# Patient Record
Sex: Female | Born: 1935 | Race: White | Hispanic: No | Smoking: Former smoker
Health system: Southern US, Community
[De-identification: ages and names within clinical notes are randomized; demographics above are authoritative.]

## PROBLEM LIST (undated history)

## (undated) DIAGNOSIS — M1990 Unspecified osteoarthritis, unspecified site: Secondary | ICD-10-CM

## (undated) DIAGNOSIS — K589 Irritable bowel syndrome without diarrhea: Secondary | ICD-10-CM

## (undated) DIAGNOSIS — E785 Hyperlipidemia, unspecified: Secondary | ICD-10-CM

## (undated) DIAGNOSIS — R42 Dizziness and giddiness: Secondary | ICD-10-CM

## (undated) DIAGNOSIS — K649 Unspecified hemorrhoids: Secondary | ICD-10-CM

## (undated) DIAGNOSIS — Z972 Presence of dental prosthetic device (complete) (partial): Secondary | ICD-10-CM

## (undated) DIAGNOSIS — G629 Polyneuropathy, unspecified: Secondary | ICD-10-CM

## (undated) DIAGNOSIS — Z974 Presence of external hearing-aid: Secondary | ICD-10-CM

## (undated) DIAGNOSIS — I10 Essential (primary) hypertension: Secondary | ICD-10-CM

## (undated) DIAGNOSIS — K219 Gastro-esophageal reflux disease without esophagitis: Secondary | ICD-10-CM

## (undated) HISTORY — DX: Unspecified hemorrhoids: K64.9

## (undated) HISTORY — DX: Irritable bowel syndrome, unspecified: K58.9

## (undated) HISTORY — DX: Gastro-esophageal reflux disease without esophagitis: K21.9

## (undated) HISTORY — DX: Essential (primary) hypertension: I10

## (undated) HISTORY — DX: Hyperlipidemia, unspecified: E78.5

## (undated) HISTORY — DX: Unspecified osteoarthritis, unspecified site: M19.90

---

## 1978-09-10 HISTORY — PX: ABDOMINAL HYSTERECTOMY: SHX81

## 2005-03-29 ENCOUNTER — Ambulatory Visit: Payer: Self-pay | Admitting: Ophthalmology

## 2005-04-03 ENCOUNTER — Ambulatory Visit: Payer: Self-pay | Admitting: Ophthalmology

## 2005-07-09 ENCOUNTER — Ambulatory Visit: Payer: Self-pay | Admitting: Ophthalmology

## 2005-12-25 ENCOUNTER — Ambulatory Visit: Payer: Self-pay

## 2007-11-13 ENCOUNTER — Ambulatory Visit: Payer: Self-pay

## 2008-11-15 ENCOUNTER — Ambulatory Visit: Payer: Self-pay

## 2013-10-12 ENCOUNTER — Encounter: Payer: Self-pay | Admitting: *Deleted

## 2013-11-02 ENCOUNTER — Ambulatory Visit: Payer: Self-pay | Admitting: General Surgery

## 2013-11-17 ENCOUNTER — Encounter: Payer: Self-pay | Admitting: General Surgery

## 2013-11-17 ENCOUNTER — Ambulatory Visit (INDEPENDENT_AMBULATORY_CARE_PROVIDER_SITE_OTHER): Payer: Medicare Other | Admitting: General Surgery

## 2013-11-17 VITALS — BP 140/72 | HR 76 | Resp 14 | Ht 64.0 in | Wt 152.0 lb

## 2013-11-17 DIAGNOSIS — Z1211 Encounter for screening for malignant neoplasm of colon: Secondary | ICD-10-CM

## 2013-11-17 MED ORDER — POLYETHYLENE GLYCOL 3350 17 GM/SCOOP PO POWD: ORAL | Status: DC

## 2013-11-17 NOTE — Patient Instructions (Addendum)
Patient to be schedule for screening colonoscopy. The patient is aware to call back for any questions or concerns.  Colonoscopy A colonoscopy is an exam to look at the entire large intestine (colon). This exam can help find problems such as tumors, polyps, inflammation, and areas of bleeding. The exam takes about 1 hour.  LET [facility] PROVIDER KNOW ABOUT:   Any allergies you have.  All medicines you are taking, including vitamins, herbs, eye drops, creams, and over-the-counter medicines.  Previous problems you or members of your family have had with the use of anesthetics.  Any blood disorders you have.  Previous surgeries you have had.  Medical conditions you have. RISKS AND COMPLICATIONS  Generally, this is a safe procedure. However, as with any procedure, complications can occur. Possible complications include:  Bleeding.  Tearing or rupture of the colon wall.  Reaction to medicines given during the exam.  Infection (rare). BEFORE THE PROCEDURE   Ask your health care provider about changing or stopping your regular medicines.  You may be prescribed an oral bowel prep. This involves drinking a large amount of medicated liquid, starting the day before your procedure. The liquid will cause you to have multiple loose stools until your stool is almost clear or light green. This cleans out your colon in preparation for the procedure.  Do not eat or drink anything else once you have started the bowel prep, unless your health care provider tells you it is safe to do so.  Arrange for someone to drive you home after the procedure. PROCEDURE   You will be given medicine to help you relax (sedative).  You will lie on your side with your knees bent.  A long, flexible tube with a light and camera on the end (colonoscope) will be inserted through the rectum and into the colon. The camera sends video back to a computer screen as it moves through the colon. The colonoscope also  releases carbon dioxide gas to inflate the colon. This helps your health care provider see the area better.  During the exam, your health care provider may take a small tissue sample (biopsy) to be examined under a microscope if any abnormalities are found.  The exam is finished when the entire colon has been viewed. AFTER THE PROCEDURE   Do not drive for 24 hours after the exam.  You may have a small amount of blood in your stool.  You may pass moderate amounts of gas and have mild abdominal cramping or bloating. This is caused by the gas used to inflate your colon during the exam.  Ask when your test results will be ready and how you will get your results. Make sure you get your test results. Document Released: 08/24/2000 Document Revised: 06/17/2013 Document Reviewed: 05/04/2013 [facility] Patient Information 2014 [employer] [place].  Patient [facility]. It is okay for patient to continue 81 mg aspirin.

## 2013-11-17 NOTE — Progress Notes (Signed)
Patient ID: Zoe Bush, female   DOB: 1933-12-16, 80 y.o.   MRN: [id number]  Chief Complaint  Patient presents with  . Colonoscopy    HPI Zoe Bush is a 80 y.o. female who presents for an evaluation of a colonoscopy. The patient denies any bowel problems at this time. She has never had a colonoscopy done before.   HPI  Past Medical History  Diagnosis Date  . Hypertension   . GERD (gastroesophageal reflux disease)   . Hyperlipidemia   . Hemorrhoid   . IBS (irritable bowel syndrome)   . Arthritis     Past Surgical History  Procedure Laterality Date  . Abdominal hysterectomy  1980    Family History  Problem Relation Age of Onset  . Heart disease Father   . Cancer Mother     brain tumor  . Cancer Sister [id number]    ovarian    Social History History  Substance Use Topics  . Smoking status: Never Smoker   . Smokeless tobacco: Not on file  . Alcohol Use: No    No Known Allergies  Current Outpatient Prescriptions  Medication Sig Dispense Refill  . aspirin 81 MG tablet Take 81 mg by mouth daily.      Marland Kitchen. atenolol (TENORMIN) 50 MG tablet Take 2 tablets by mouth daily.      Marland Kitchen. docusate sodium (COLACE) 100 MG capsule Take 100 mg by mouth 2 (two) times daily.      Marland Kitchen. omeprazole (PRILOSEC) 20 MG capsule Take 20 mg by mouth daily.      . simvastatin (ZOCOR) 80 MG tablet Take 1 tablet by mouth daily.      . polyethylene glycol powder (GLYCOLAX/MIRALAX) powder 255 grams one bottle for colonoscopy prep  255 g  0   No current facility-administered medications for this visit.    Review of Systems Review of Systems  Constitutional: Negative.   Respiratory: Negative.   Cardiovascular: Negative.   Gastrointestinal: Negative.     Blood pressure 140/72, pulse 76, resp. rate 14, height 5\' 4"  (1.626 m), weight 152 lb (68.947 kg).  Physical Exam Physical Exam  Constitutional: She is oriented to person, place, and time. She appears well-developed and well-nourished.  Neck: Neck  supple. No thyromegaly present.  Cardiovascular: Normal rate, regular rhythm and normal heart sounds.   No murmur heard. Pulmonary/Chest: Effort normal and breath sounds normal.  Abdominal: Soft. Bowel sounds are normal. There is no hepatosplenomegaly. There is no tenderness. No hernia.  Lymphadenopathy:    She has no cervical adenopathy.  Neurological: She is alert and oriented to person, place, and time.  Skin: Skin is warm and dry.    Data Reviewed    Assessment    Pt is willing to have screening colonoscopy.     Plan      Procedure , risks and benefits discussed.    This patient has been [facility]. It is okay for patient to continue 81 mg aspirin.    Django Nguyen G 11/18/2013, 6:19 AM

## 2013-11-18 ENCOUNTER — Encounter: Payer: Self-pay | Admitting: General Surgery

## 2013-12-08 ENCOUNTER — Telehealth: Payer: Self-pay

## 2013-12-08 NOTE — Telephone Encounter (Signed)
Patient called to cancel [facility]. She states that she does not want to have this done at this time. She will call back if she would like to reschedule this.

## 2014-07-12 ENCOUNTER — Encounter: Payer: Self-pay | Admitting: General Surgery

## 2014-09-15 ENCOUNTER — Ambulatory Visit: Payer: Self-pay | Admitting: Physician Assistant

## 2014-09-15 ENCOUNTER — Ambulatory Visit: Payer: Self-pay | Admitting: Family Medicine

## 2014-09-21 ENCOUNTER — Ambulatory Visit: Payer: Self-pay | Admitting: Family Medicine

## 2014-09-30 ENCOUNTER — Ambulatory Visit: Payer: Self-pay | Admitting: Family Medicine

## 2014-10-21 ENCOUNTER — Ambulatory Visit: Payer: Self-pay | Admitting: Family Medicine

## 2015-01-21 ENCOUNTER — Telehealth: Payer: Self-pay | Admitting: Internal Medicine

## 2015-01-21 ENCOUNTER — Other Ambulatory Visit: Payer: Self-pay | Admitting: Internal Medicine

## 2015-01-21 DIAGNOSIS — M810 Age-related osteoporosis without current pathological fracture: Secondary | ICD-10-CM | POA: Insufficient documentation

## 2015-01-21 NOTE — Telephone Encounter (Signed)
She has osteoporosis by recent bone density scan done May 2016   Bone Density scores received, she has bone loss which is significant  And indicative for osteoporosis.  I recommend treating with medication but would need to discuss the pros and cons in an office visit . Increase  calcium to 1800 mg daily through diet and supplements ,  2000 units of vitamin d and weight bearing exercise on a regular basis, and make appt to discuss    I recommend getting the majority of your calcium and Vitamin D  through diet rather than supplements given the recent association of calcium supplements with increased coronary artery calcium scores  Try the Silk almond Light,  Original formula.  Delicious,  Low carb,  Low cal,  Cholesterol free

## 2015-01-21 NOTE — Telephone Encounter (Signed)
Noted in incorrect chart.

## 2015-08-29 ENCOUNTER — Ambulatory Visit
Admission: EM | Admit: 2015-08-29 | Discharge: 2015-08-29 | Disposition: A | Discharge status: Home / Self Care | Payer: Medicare Other | Attending: Emergency Medicine | Admitting: Emergency Medicine

## 2015-08-29 DIAGNOSIS — K1120 Sialoadenitis, unspecified: Secondary | ICD-10-CM

## 2015-08-29 DIAGNOSIS — R220 Localized swelling, mass and lump, head: Secondary | ICD-10-CM

## 2015-08-29 LAB — BASIC METABOLIC PANEL
Anion gap: 9 (ref 5–15)
BUN: 14 mg/dL (ref 6–20)
CALCIUM: 10.4 mg/dL — AB (ref 8.9–10.3)
CO2: 25 mmol/L (ref 22–32)
Chloride: 106 mmol/L (ref 101–111)
Creatinine, Ser: 1 mg/dL (ref 0.44–1.00)
GFR, EST NON AFRICAN AMERICAN: 52 mL/min — AB (ref >60)
Glucose, Bld: 117 mg/dL — ABNORMAL HIGH (ref 65–99)
POTASSIUM: 4.4 mmol/L (ref 3.5–5.1)
SODIUM: 140 mmol/L (ref 135–145)

## 2015-08-29 MED ORDER — AMOXICILLIN-POT CLAVULANATE 875-125 MG PO TABS: 1.0000 | ORAL_TABLET | Freq: Two times a day (BID) | ORAL | Status: DC

## 2015-08-29 NOTE — Discharge Instructions (Signed)
Increase your fluid intake. You may do warm saltwater gargles/rinses to help keep mouth clean. Try some sour candy like lemon drops or cabbage patch sour kids candy to help express any stone that you may have. Make sure you finish all the antibiotics. You may take Tylenol and 400 mg of ibuprofen as needed for pain. Do not take the ibuprofen for more than a week. Follow-up with Dr. Danie Chandlerronk. I am writing her a note today to let her know that I saw you today in clinic and that your kidney function was acceptable, so you should be able to get your CT with contrast.

## 2015-08-29 NOTE — ED Notes (Signed)
Patient complains of right ear pain. She has a noticeable knot on the side of her jaw and states that she has had this knot for one year but, was never painful until today. She states that she was going to have a CT on knot but, has not scheduled her appointment yet. She states that she feels like she has an ear ache. Patient does wear hearing aids in both ears and also has had all teeth removed.

## 2015-08-29 NOTE — ED Provider Notes (Signed)
HPI  SUBJECTIVE:  Zoe Bush is a 81 y.o. female who presents with painful swelling at the angle of her right jaw. States that she has had a "knot" there for the past year, is supposed to have a CT done by her PMD for this in January, but states that it became acutely larger and painful today. It was not painful up until today. Patient describes pain as stabbing, constant. She reports right ear pain. Symptoms are worse with applying heating pad, no alleviating factors. She has also tried Tylenol and ice. No nausea, vomiting, fevers, change in hearing, sore throat, upper respiratory like symptoms. No facial redness. The jaw swelling does not wax and wane in size. It is not associated with eating, salivation. She denies stiff neck. No trauma. No trismus. She is completely edentulous. Past medical history negative for cancer, diabetes, hypertension. She has a 5 year pack history of smoking, quit over 50 years ago. She states that she has had an ultrasound of this knot, which was negative. No new changes in medicines.    Past Medical History  Diagnosis Date  . Hypertension   . GERD (gastroesophageal reflux disease)   . Hyperlipidemia   . Hemorrhoid   . IBS (irritable bowel syndrome)   . Arthritis     Past Surgical History  Procedure Laterality Date  . Abdominal hysterectomy  1980    Family History  Problem Relation Age of Onset  . Heart disease Father   . Cancer Mother     brain tumor  . Cancer Sister [id number]    ovarian    Social History  Substance Use Topics  . Smoking status: Never Smoker   . Smokeless tobacco: None  . Alcohol Use: No    No current facility-administered medications for this encounter.  Current outpatient prescriptions:  .  aspirin 81 MG tablet, Take 81 mg by mouth daily., Disp: , Rfl:  .  atenolol (TENORMIN) 50 MG tablet, Take 2 tablets by mouth daily., Disp: , Rfl:  .  docusate sodium (COLACE) 100 MG capsule, Take 100 mg by mouth 2 (two) times daily., Disp:  , Rfl:  .  doxepin (SINEQUAN) 100 MG capsule, Take 100 mg by mouth., Disp: , Rfl:  .  omeprazole (PRILOSEC) 20 MG capsule, Take 20 mg by mouth daily., Disp: , Rfl:  .  polyethylene glycol powder (GLYCOLAX/MIRALAX) powder, 255 grams one bottle for colonoscopy prep, Disp: 255 g, Rfl: 0 .  simvastatin (ZOCOR) 80 MG tablet, Take 1 tablet by mouth daily., Disp: , Rfl:  .  amoxicillin-clavulanate (AUGMENTIN) 875-125 MG tablet, Take 1 tablet by mouth 2 (two) times daily. X 10 days, Disp: 20 tablet, Rfl: 0  No Known Allergies   ROS  As noted in HPI.   Physical Exam  BP 163/57 mmHg  Pulse 63  Temp(Src) 98.3 F (36.8 C) (Tympanic)  Resp 16  Ht 5\' 4"  (1.626 m)  Wt 160 lb (72.576 kg)  BMI 27.45 kg/m2  SpO2 100%  Constitutional: Well developed, well nourished, no acute distress. Voice normal.  Eyes:  EOMI, conjunctiva normal bilaterally HENT: Normocephalic, atraumatic,mucus membranes moist. Edentulous. No gingival tenderness, swelling. No swelling underneath the tongue. No expressible purulent drainage from [facility] or Stensen's duct. No tenderness over the parotid gland. Bilateral TMs normal. Positive tender swelling at angle of the right jaw extending inferiorly to jaw along submandibular gland. No mastoid tenderness   neck: No cervical lymphadenopathy, no meningismus. Respiratory: Normal inspiratory effort Cardiovascular: Normal rate GI: nondistended  skin: No rash, skin intact Musculoskeletal: no deformities Neurologic: Alert & oriented x 3, no focal neuro deficits Psychiatric: Speech and behavior appropriate   ED Course   Medications - No data to display  Orders Placed This Encounter  Procedures  . Basic metabolic panel    Standing Status: Standing     Number of Occurrences: 1     Standing Expiration Date:     Results for orders placed or performed during the hospital encounter of 08/29/15 (from the past 24 hour(s))  Basic metabolic panel     Status: Abnormal   Collection  Time: 08/29/15  8:33 PM  Result Value Ref Range   Sodium 140 135 - 145 mmol/L   Potassium 4.4 3.5 - 5.1 mmol/L   Chloride 106 101 - 111 mmol/L   CO2 25 22 - 32 mmol/L   Glucose, Bld 117 (H) 65 - 99 mg/dL   BUN 14 6 - 20 mg/dL   Creatinine, Ser 1.61 0.44 - 1.00 mg/dL   Calcium 09.6 (H) 8.9 - 10.3 mg/dL   GFR calc non Af Amer 52 (L) >60 mL/min   GFR calc Af Amer >60 >60 mL/min   Anion gap 9 5 - 15   No results found.  ED Clinical Impression  Sialadenitis  Jaw swelling   ED Assessment/Plan  No u/s results in Epic.  Presentation most suggestive of a sialoadenitis. Given that she is elderly bacterial/supperative sialadenitis high in differential. Viral infection, tumor also on differential. We'll check kidney function to evaluate patient's ability get IV contrast. We will write her primary care physician and request that CT with IV contrast be done as an outpatient within the week. Home with Augmentin, lemon drops, improved oral hygiene,  salt water rinses, increase fluid intake.  Lab reviewed. GFR 52, acceptable  for IV contrast. Patient to follow-up with primary care physician in several days. To the ER for any airway compromise, fevers, stiff neck. Discussed labs, MDM, plan and followup with patient  Discussed sn/sx that should prompt return to the UC or ED. Patient agrees with plan.  *This clinic note was created using Dragon dictation software. Therefore, there may be occasional mistakes despite careful proofreading.  ?   Domenick Gong, MD 08/29/15 2135

## 2015-09-08 ENCOUNTER — Other Ambulatory Visit: Payer: Self-pay | Admitting: Family Medicine

## 2015-09-08 DIAGNOSIS — R220 Localized swelling, mass and lump, head: Secondary | ICD-10-CM

## 2015-09-09 ENCOUNTER — Ambulatory Visit: Payer: Medicare Other

## 2015-09-13 ENCOUNTER — Ambulatory Visit: Payer: Medicare Other

## 2015-09-15 ENCOUNTER — Ambulatory Visit
Admission: RE | Admit: 2015-09-15 | Discharge: 2015-09-15 | Disposition: A | Discharge status: Home / Self Care | Payer: Medicare Other | Source: Ambulatory Visit | Attending: Family Medicine | Admitting: Family Medicine

## 2015-09-15 DIAGNOSIS — R221 Localized swelling, mass and lump, neck: Secondary | ICD-10-CM | POA: Diagnosis not present

## 2015-09-15 DIAGNOSIS — R220 Localized swelling, mass and lump, head: Secondary | ICD-10-CM

## 2015-09-15 MED ORDER — IOHEXOL 300 MG/ML  SOLN
75.0000 mL | Freq: Once | INTRAMUSCULAR | Status: AC | PRN
  Administered 2015-09-15: 75 mL via INTRAVENOUS

## 2015-10-12 ENCOUNTER — Other Ambulatory Visit: Payer: Self-pay | Admitting: Physician Assistant

## 2015-10-12 DIAGNOSIS — R928 Other abnormal and inconclusive findings on diagnostic imaging of breast: Secondary | ICD-10-CM

## 2015-10-26 ENCOUNTER — Other Ambulatory Visit: Payer: Medicare Other

## 2015-10-26 ENCOUNTER — Ambulatory Visit: Payer: Medicare Other

## 2016-10-07 IMAGING — CR DG KNEE COMPLETE 4+V*R*
4 series · 4 of 4 positions shown · non-contrast
Comparison: None.

CLINICAL DATA: Chronic right knee pain, increasing over the last 2
month, some burning in the right thigh recently

EXAM:
RIGHT KNEE - COMPLETE 4+ VIEW

[knee ap]
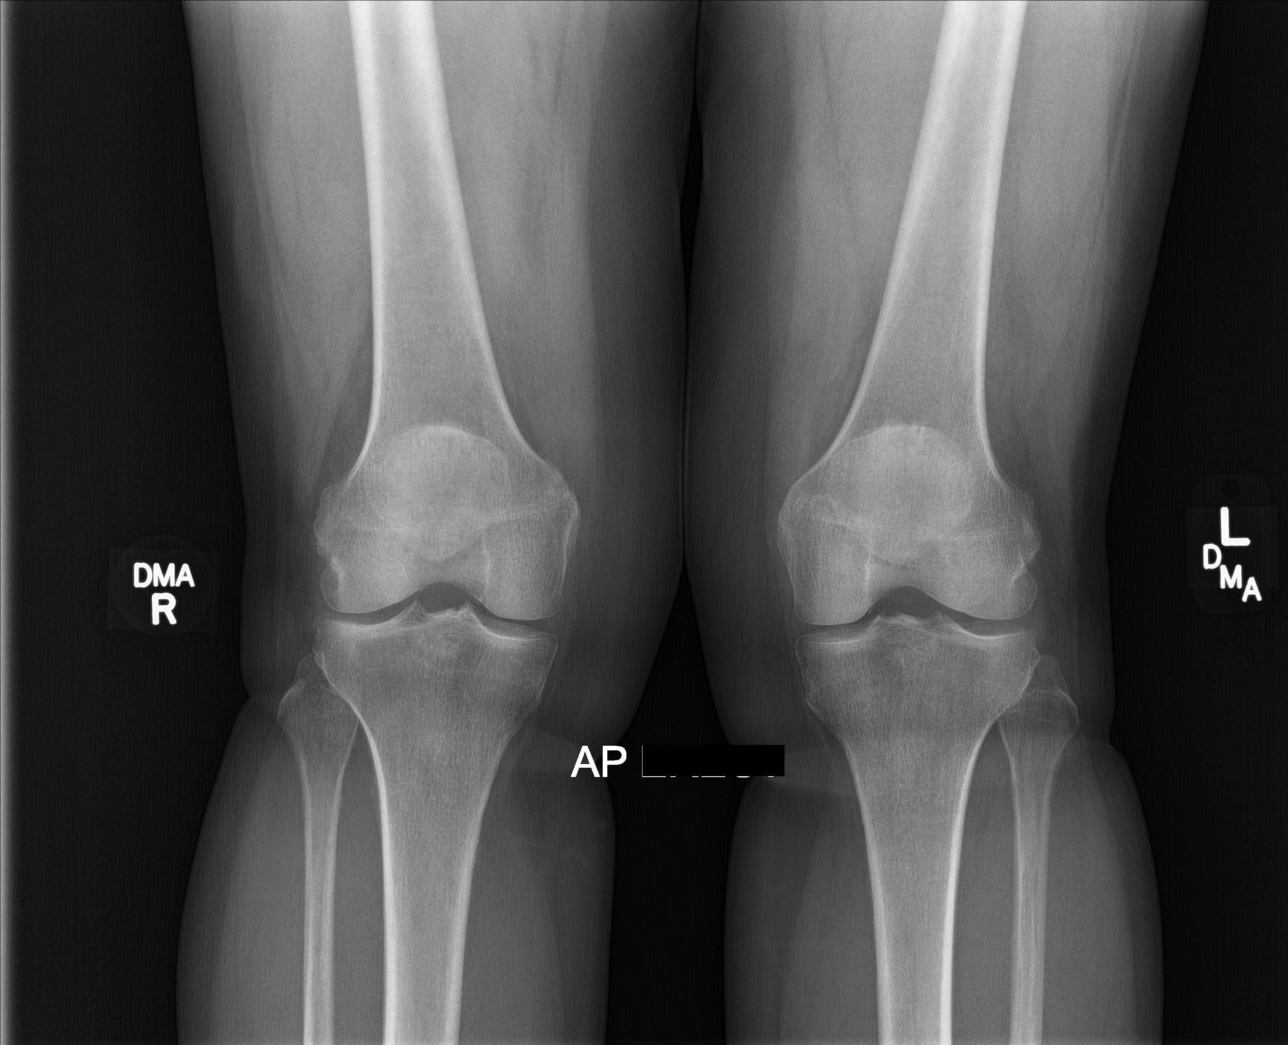

[knee obl (1 of 2)]
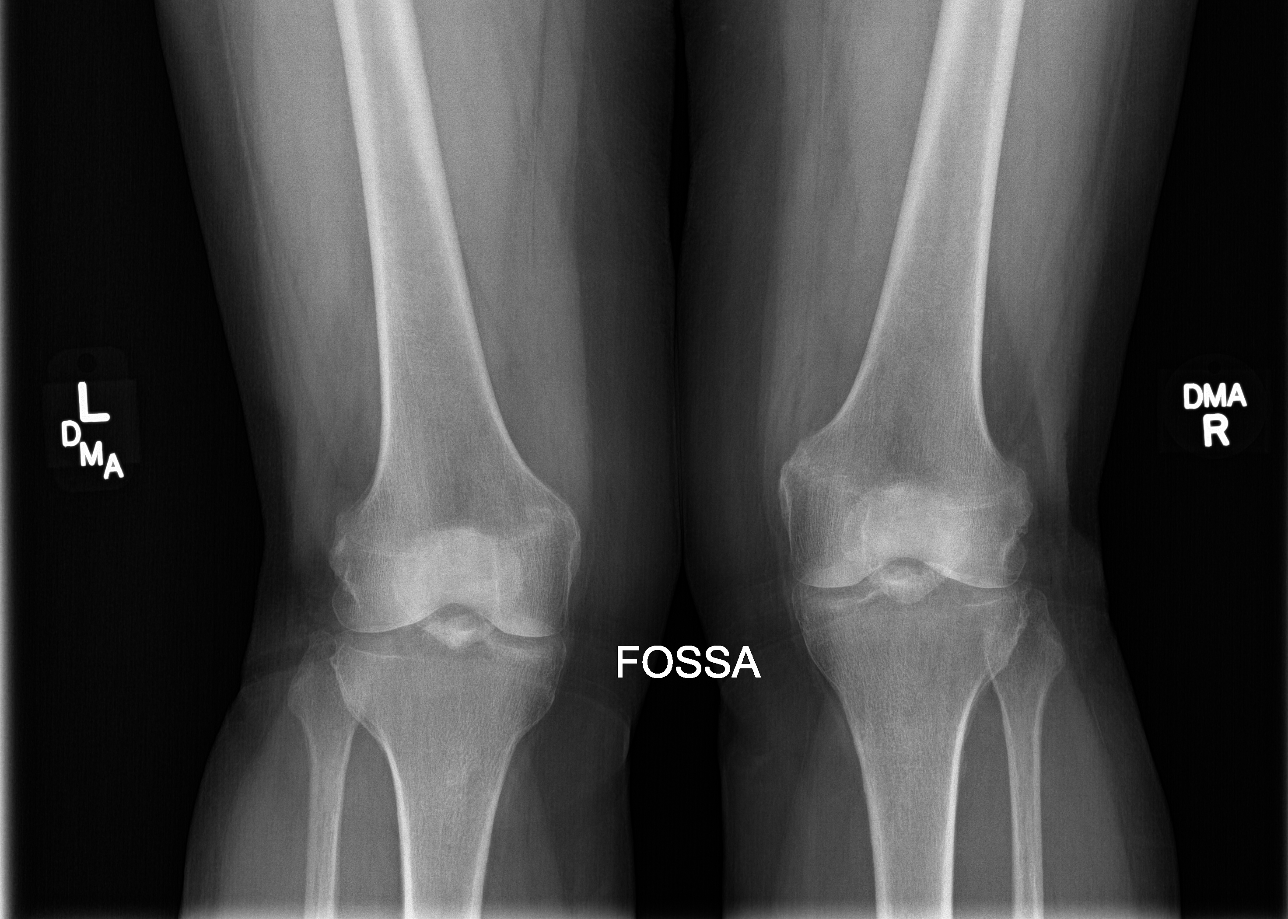

[knee obl (2 of 2)]
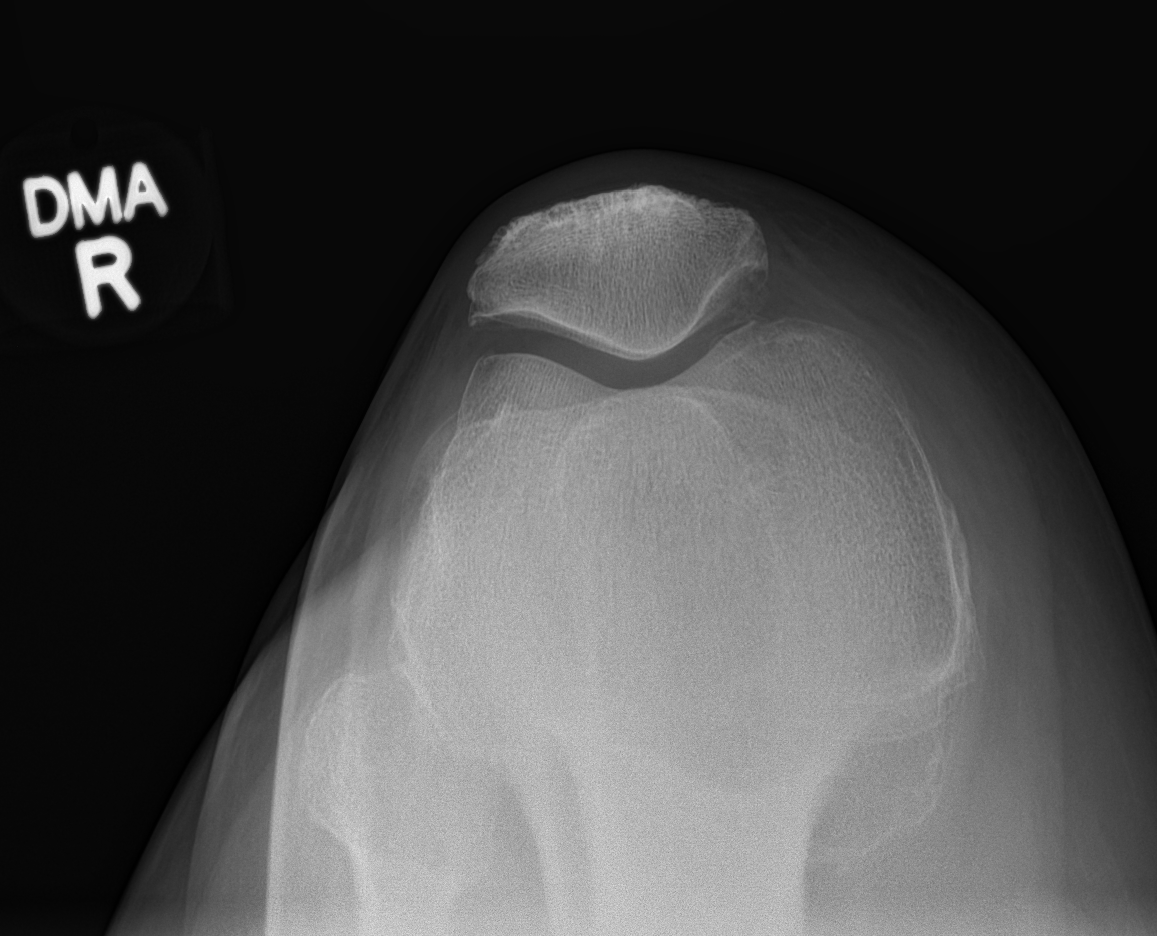

[knee lat]
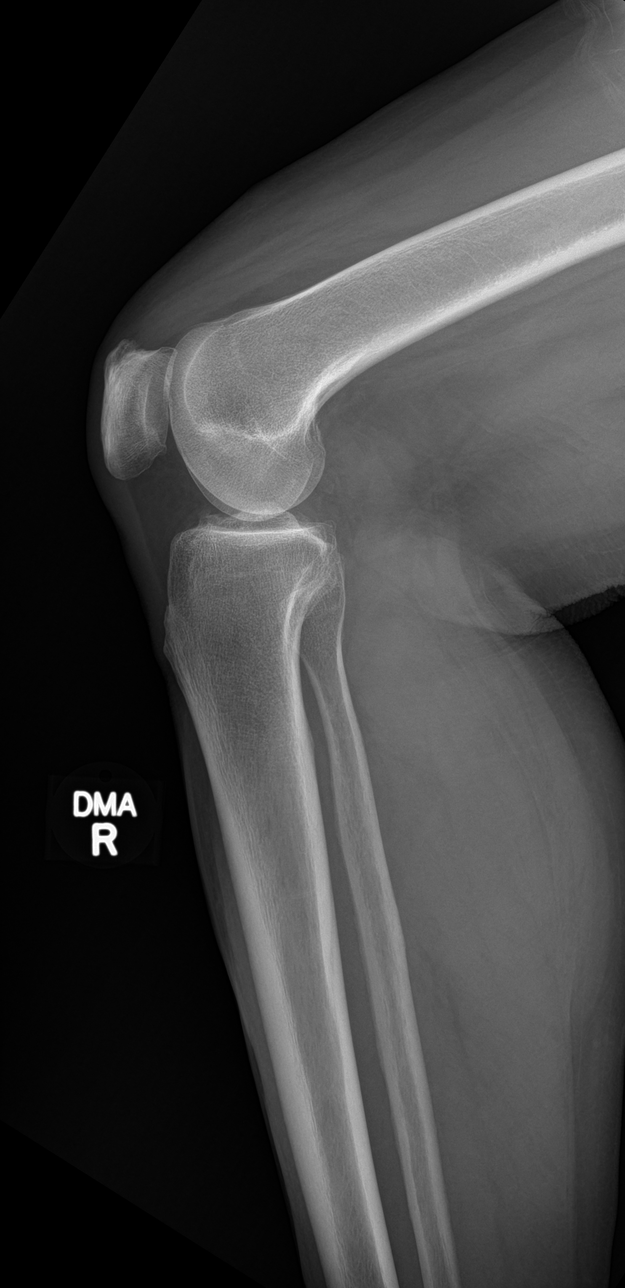

[4 of 4 positions shown; findings below may reference images not displayed]

FINDINGS: Standing views of the knees in the frontal position show some loss
of joint space involving the lateral compartment of the right knee
with mild degenerative spurring. Otherwise the knee joint spaces
appear well preserved. No right joint effusion is seen. The patella
on the right is in normal position with only mild spurring present
for age.
IMPRESSION: Mild degenerative joint disease of the right knee involving
primarily the lateral compartment. No fracture or effusion.

## 2021-07-04 ENCOUNTER — Encounter: Payer: Self-pay | Admitting: Anesthesiology

## 2021-07-04 ENCOUNTER — Encounter: Payer: Self-pay | Admitting: Ophthalmology

## 2021-07-17 ENCOUNTER — Ambulatory Visit: Admission: RE | Admit: 2021-07-17 | Payer: Medicare Other | Source: Home / Self Care | Admitting: Ophthalmology

## 2021-07-17 HISTORY — DX: Polyneuropathy, unspecified: G62.9

## 2021-07-17 HISTORY — DX: Dizziness and giddiness: R42

## 2021-07-17 HISTORY — DX: Presence of dental prosthetic device (complete) (partial): Z97.2

## 2021-07-17 HISTORY — DX: Presence of external hearing-aid: Z97.4

## 2021-07-17 SURGERY — PHACOEMULSIFICATION, CATARACT, WITH IOL INSERTION
Anesthesia: Topical | Laterality: Right

## 2022-04-12 ENCOUNTER — Encounter: Payer: Self-pay | Admitting: Ophthalmology

## 2022-04-12 NOTE — Discharge Instructions (Signed)

## 2022-04-17 ENCOUNTER — Other Ambulatory Visit: Payer: Self-pay

## 2022-04-17 ENCOUNTER — Ambulatory Visit (AMBULATORY_SURGERY_CENTER): Payer: Medicare Other | Admitting: Anesthesiology

## 2022-04-17 ENCOUNTER — Encounter
Admission: RE | Disposition: A | Discharge status: Home / Self Care | Payer: Self-pay | Source: Home / Self Care | Attending: Ophthalmology

## 2022-04-17 ENCOUNTER — Ambulatory Visit: Payer: Medicare Other | Admitting: Anesthesiology

## 2022-04-17 ENCOUNTER — Ambulatory Visit
Admission: RE | Admit: 2022-04-17 | Discharge: 2022-04-17 | Disposition: A | Discharge status: Home / Self Care | Payer: Medicare Other | Attending: Ophthalmology | Admitting: Ophthalmology

## 2022-04-17 ENCOUNTER — Encounter: Payer: Self-pay | Admitting: Ophthalmology

## 2022-04-17 DIAGNOSIS — I10 Essential (primary) hypertension: Secondary | ICD-10-CM | POA: Diagnosis not present

## 2022-04-17 DIAGNOSIS — H2511 Age-related nuclear cataract, right eye: Secondary | ICD-10-CM | POA: Insufficient documentation

## 2022-04-17 DIAGNOSIS — H2589 Other age-related cataract: Secondary | ICD-10-CM | POA: Diagnosis not present

## 2022-04-17 DIAGNOSIS — Z87891 Personal history of nicotine dependence: Secondary | ICD-10-CM | POA: Diagnosis not present

## 2022-04-17 DIAGNOSIS — Z79899 Other long term (current) drug therapy: Secondary | ICD-10-CM | POA: Insufficient documentation

## 2022-04-17 DIAGNOSIS — K219 Gastro-esophageal reflux disease without esophagitis: Secondary | ICD-10-CM | POA: Insufficient documentation

## 2022-04-17 HISTORY — PX: ANTERIOR VITRECTOMY: SHX1173

## 2022-04-17 HISTORY — PX: CATARACT EXTRACTION W/PHACO: SHX586

## 2022-04-17 SURGERY — PHACOEMULSIFICATION, CATARACT, WITH IOL INSERTION
Anesthesia: Monitor Anesthesia Care | Site: Eye | Laterality: Right

## 2022-04-17 MED ORDER — CARBACHOL 0.01 % IO SOLN
INTRAOCULAR | Status: DC | PRN
  Administered 2022-04-17: 0.5 mL via INTRAOCULAR

## 2022-04-17 MED ORDER — SIGHTPATH DOSE#1 BSS IO SOLN
INTRAOCULAR | Status: DC | PRN
  Administered 2022-04-17: 143 mL via OPHTHALMIC

## 2022-04-17 MED ORDER — TETRACAINE HCL 0.5 % OP SOLN
1.0000 [drp] | OPHTHALMIC | Status: AC | PRN
  Administered 2022-04-17 (×3): 1 [drp] via OPHTHALMIC

## 2022-04-17 MED ORDER — TRYPAN BLUE 0.06 % IO SOSY
PREFILLED_SYRINGE | INTRAOCULAR | Status: DC | PRN
  Administered 2022-04-17: 0.5 mL via INTRAOCULAR

## 2022-04-17 MED ORDER — ACETYLCHOLINE CHLORIDE 20 MG IO SOLR
INTRAOCULAR | Status: DC | PRN
  Administered 2022-04-17: 20 mg via INTRAOCULAR

## 2022-04-17 MED ORDER — SIGHTPATH DOSE#1 BSS IO SOLN
INTRAOCULAR | Status: DC | PRN
  Administered 2022-04-17: 15 mL

## 2022-04-17 MED ORDER — SIGHTPATH DOSE#1 NA CHONDROIT SULF-NA HYALURON 40-17 MG/ML IO SOLN
INTRAOCULAR | Status: DC | PRN
  Administered 2022-04-17: 1 mL via INTRAOCULAR

## 2022-04-17 MED ORDER — BSS IO SOLN
INTRAOCULAR | Status: DC | PRN
  Administered 2022-04-17 (×5): 15 mL

## 2022-04-17 MED ORDER — SIGHTPATH DOSE#1 BSS IO SOLN
INTRAOCULAR | Status: DC | PRN
  Administered 2022-04-17: .5 mL

## 2022-04-17 MED ORDER — PROPOFOL 10 MG/ML IV BOLUS
INTRAVENOUS | Status: DC | PRN
  Administered 2022-04-17: 30 mg via INTRAVENOUS

## 2022-04-17 MED ORDER — MOXIFLOXACIN HCL 0.5 % OP SOLN
OPHTHALMIC | Status: DC | PRN
  Administered 2022-04-17: 0.2 mL via OPHTHALMIC

## 2022-04-17 MED ORDER — NA CHONDROIT SULF-NA HYALURON 40-17 MG/ML IO SOLN
INTRAOCULAR | Status: DC | PRN
  Administered 2022-04-17: 1 mL via INTRAOCULAR

## 2022-04-17 MED ORDER — BRIMONIDINE TARTRATE-TIMOLOL 0.2-0.5 % OP SOLN
OPHTHALMIC | Status: DC | PRN
  Administered 2022-04-17: 1 [drp] via OPHTHALMIC

## 2022-04-17 MED ORDER — ONDANSETRON HCL 4 MG/2ML IJ SOLN: 4.0000 mg | Freq: Once | INTRAMUSCULAR | Status: DC | PRN

## 2022-04-17 MED ORDER — ARMC OPHTHALMIC DILATING DROPS
1.0000 | OPHTHALMIC | Status: AC | PRN
  Administered 2022-04-17 (×3): 1 via OPHTHALMIC

## 2022-04-17 MED ORDER — LIDOCAINE HCL 2 % IJ SOLN
INTRAMUSCULAR | Status: DC | PRN
  Administered 2022-04-17: 2.25 mL

## 2022-04-17 MED ORDER — FENTANYL CITRATE PF 50 MCG/ML IJ SOSY: 25.0000 ug | PREFILLED_SYRINGE | INTRAMUSCULAR | Status: DC | PRN

## 2022-04-17 MED ORDER — FENTANYL CITRATE (PF) 100 MCG/2ML IJ SOLN
INTRAMUSCULAR | Status: DC | PRN
  Administered 2022-04-17: 50 ug via INTRAVENOUS

## 2022-04-17 MED ORDER — MIDAZOLAM HCL 2 MG/2ML IJ SOLN
INTRAMUSCULAR | Status: DC | PRN
  Administered 2022-04-17: 1 mg via INTRAVENOUS

## 2022-04-17 MED ORDER — LACTATED RINGERS IV SOLN: INTRAVENOUS | Status: DC

## 2022-04-17 MED ORDER — BUPIVACAINE HCL (PF) 0.75 % IJ SOLN
INTRAMUSCULAR | Status: DC | PRN
  Administered 2022-04-17: 2.25 mL

## 2022-04-17 MED ORDER — TRIAMCINOLONE ACETONIDE 40 MG/ML IJ SUSP
INTRAMUSCULAR | Status: DC | PRN
  Administered 2022-04-17: .5 mL

## 2022-04-17 MED ORDER — NEOMYCIN-POLYMYXIN-DEXAMETH 3.5-10000-0.1 OP OINT
TOPICAL_OINTMENT | OPHTHALMIC | Status: DC | PRN
  Administered 2022-04-17: 1 via OPHTHALMIC

## 2022-04-17 SURGICAL SUPPLY — 28 items
BANDAGE EYE OVAL 2 1/8 X 2 5/8 (GAUZE/BANDAGES/DRESSINGS) ×2
BNDG EYE OVAL 2 1/8 X 2 5/8 (GAUZE/BANDAGES/DRESSINGS) IMPLANT
CANNULA ANT/CHMB 27G (MISCELLANEOUS) IMPLANT
CANNULA ANT/CHMB 27GA (MISCELLANEOUS) ×6 IMPLANT
CATARACT SUITE SIGHTPATH (MISCELLANEOUS) ×2 IMPLANT
FEE CATARACT SUITE SIGHTPATH (MISCELLANEOUS) ×1 IMPLANT
GLIDE IOL SHEET 35MLX5ML (MISCELLANEOUS) ×1 IMPLANT
GLOVE SURG ENC TEXT LTX SZ8 (GLOVE) ×2 IMPLANT
GLOVE SURG TRIUMPH 8.0 PF LTX (GLOVE) ×2 IMPLANT
LENS IOL +16.5 DIOP 13X5.5X (Intraocular Lens) ×1 IMPLANT
LENS IOL KELMAN MF 16.5 (Intraocular Lens) IMPLANT
LENS IOL KELMAN MULTIFLEX 16.5 (Intraocular Lens) ×2 IMPLANT
LENS IOL TECNIS EYHANCE 20.0 (Intraocular Lens) IMPLANT
NDL 18GX1X1/2 (RX/OR ONLY) (NEEDLE) IMPLANT
NDL FILTER BLUNT 18X1 1/2 (NEEDLE) ×1 IMPLANT
NDL HYPO 18GX1.5 BLUNT FILL (NEEDLE) IMPLANT
NDL RETROBULBAR .5 NSTRL (NEEDLE) ×1 IMPLANT
NEEDLE 18GX1X1/2 (RX/OR ONLY) (NEEDLE) ×4 IMPLANT
NEEDLE FILTER BLUNT 18X 1/2SAF (NEEDLE) ×1
NEEDLE FILTER BLUNT 18X1 1/2 (NEEDLE) ×1 IMPLANT
NEEDLE HYPO 18GX1.5 BLUNT FILL (NEEDLE) ×2 IMPLANT
PACK VIT ANT 23G (MISCELLANEOUS) ×1 IMPLANT
RING MALYGIN (MISCELLANEOUS) IMPLANT
SUT ETHILON 10-0 CS-B-6CS-B-6 (SUTURE) ×2
SUTURE EHLN 10-0 CS-B-6CS-B-6 (SUTURE) IMPLANT
SYR 3ML LL SCALE MARK (SYRINGE) ×4 IMPLANT
SYR 5ML LL (SYRINGE) ×1 IMPLANT
WATER STERILE IRR 250ML POUR (IV SOLUTION) ×2 IMPLANT

## 2022-04-17 NOTE — Transfer of Care (Signed)
Immediate Anesthesia Transfer of Care Note  Patient: Zoe Bush  Procedure(s) Performed: CATARACT EXTRACTION PHACO AND INTRAOCULAR LENS PLACEMENT (IOC) RIGHT VISION BLUE (Right: Eye) ANTERIOR VITRECTOMY (Right: Eye)  Patient Location: PACU  Anesthesia Type:MAC  Level of Consciousness: awake, alert  and oriented  Airway & Oxygen Therapy: Patient Spontanous Breathing  Post-op Assessment: Report given to RN and Post -op Vital signs reviewed and stable  Post vital signs: Reviewed and stable  Last Vitals:  Vitals Value Taken Time  BP    Temp    Pulse    Resp    SpO2      Last Pain:  Vitals:   04/17/22 0953  TempSrc: Temporal  PainSc: 0-No pain         Complications: No notable events documented.

## 2022-04-17 NOTE — Anesthesia Preprocedure Evaluation (Signed)
Anesthesia Evaluation  Patient identified by MRN, date of birth, ID band Patient awake    Reviewed: Allergy & Precautions, H&P , NPO status , Patient's Chart, lab work & pertinent test results, reviewed documented beta blocker date and time   Airway Mallampati: II  TM Distance: >3 FB Neck ROM: full    Dental no notable dental hx. (+) Teeth Intact   Pulmonary neg pulmonary ROS, former smoker,    Pulmonary exam normal breath sounds clear to auscultation       Cardiovascular Exercise Tolerance: Poor hypertension, On Medications negative cardio ROS   Rhythm:regular Rate:Normal     Neuro/Psych negative neurological ROS  negative psych ROS   GI/Hepatic negative GI ROS, Neg liver ROS, GERD  Medicated,  Endo/Other  negative endocrine ROSdiabetes  Renal/GU      Musculoskeletal   Abdominal   Peds  Hematology negative hematology ROS (+)   Anesthesia Other Findings   Reproductive/Obstetrics negative OB ROS                             Anesthesia Physical Anesthesia Plan  ASA: 2  Anesthesia Plan: MAC   Post-op Pain Management:    Induction:   PONV Risk Score and Plan:   Airway Management Planned:   Additional Equipment:   Intra-op Plan:   Post-operative Plan:   Informed Consent: I have reviewed the patients History and Physical, chart, labs and discussed the procedure including the risks, benefits and alternatives for the proposed anesthesia with the patient or authorized representative who has indicated his/her understanding and acceptance.       Plan Discussed with: CRNA  Anesthesia Plan Comments:         Anesthesia Quick Evaluation

## 2022-04-17 NOTE — Op Note (Addendum)
PREOPERATIVE DIAGNOSIS:  Nuclear sclerotic cataract of the right eye.   POSTOPERATIVE DIAGNOSIS:  Cataract   OPERATIVE PROCEDURE:ORPROCALL@   SURGEON:  Galen Manila, MD.   ANESTHESIA:  Anesthesiologist: Yevette Edwards, MD CRNA: Karoline Caldwell, CRNA  1.      Managed anesthesia care. 2.      0.70ml of Shugarcaine was instilled in the eye following the paracentesis. 3.retrobulbar block 4.5 cc   COMPLICATIONS:   A Seemingly stable Anterior capsule rent extended past the equator when the patient sat up violently and was yellling in fear. The anterior chamber shallowed and the lens came forward extending the rent.Just one quadrant of cat. Had been removed at that point.  The Remainder of the lens fell toward the vitreous. Anterior vitractomy was performed with Kenalog staining. The main incision was enlarged and the ACIOL was placed using a sheets glide. Three 10-0  sutures were placed and the eye was water tight. I/A was used to clean up the [facility].   [facility] will be contacted today for PPV lensectomy soon.  The events of the operation were discussed with patient and family.   TECHNIQUE:   Stop and chop   DESCRIPTION OF PROCEDURE:  The patient was examined and consented in the preoperative holding area where the aforementioned topical anesthesia was applied to the right eye and then brought back to the Operating Room where the right eye was prepped and draped in the usual sterile ophthalmic fashion and a lid speculum was placed. A paracentesis was created with the side port blade and the anterior chamber was filled with viscoelastic. A near clear corneal incision was performed with the steel keratome. A continuous curvilinear capsulorrhexis was performed with a cystotome followed by the capsulorrhexis forceps. Hydrodissection and hydrodelineation were carried out with BSS on a blunt cannula. The lens was removed in a stop and chop  technique and the remaining cortical material was removed with the  irrigation-aspiration handpiece. ( SEE ABOVE ) The remaining viscoelastic was removed from the eye with the irrigation-aspiration handpiece. The wounds were hydrated. The anterior chamber was flushed with BSS and the eye was inflated to physiologic pressure. 0.72ml of Vigamox was placed in the anterior chamber. The wounds were found to be water tight. The eye was dressed with Combiga The eye was patched..  Implant Name Type Inv. Item Serial No. Manufacturer Lot No. LRB No. Used Action  LENS IOL TECNIS EYHANCE 20.0 - [id number] Intraocular Lens LENS IOL TECNIS EYHANCE 20.0 [id number] [employer]  Right 1 Wasted  LENS IOL [facility] MULTIFLEX 16.5 - [id number] Intraocular Lens LENS IOL [employer] MULTIFLEX 16.5 [id number] [employer]  Right 1 Implanted   Procedure(s) with comments: CATARACT EXTRACTION PHACO AND INTRAOCULAR LENS PLACEMENT (IOC) RIGHT VISION BLUE (Right) - 71.59 5:03.8 ANTERIOR VITRECTOMY (Right)  Electronically signed: Galen Manila 04/17/2022 11:58 AM

## 2022-04-17 NOTE — H&P (Signed)
[facility]   Primary Care Physician:  Diana Eves, MD Ophthalmologist: Dr. Caprice Red  Pre-Procedure History & Physical: HPI:  Zoe Bush is a 88 y.o. female here for cataract surgery.   Past Medical History:  Diagnosis Date   Arthritis    GERD (gastroesophageal reflux disease)    Hemorrhoid    Hyperlipidemia    Hypertension    IBS (irritable bowel syndrome)    Neuropathy    lower legs   Vertigo    Wears dentures    full upper and lower   Wears hearing aid in both ears     Past Surgical History:  Procedure Laterality Date   ABDOMINAL HYSTERECTOMY  1980    Prior to Admission medications   Medication Sig Start Date End Date Taking? Authorizing Provider  amLODipine (NORVASC) 5 MG tablet Take 5 mg by mouth daily.   Yes [provider]  ASPIRIN 81 PO Take by mouth daily.   Yes [provider]  docusate sodium (COLACE) 100 MG capsule Take 100 mg by mouth.   Yes [provider]  doxepin (SINEQUAN) 100 MG capsule Take 100 mg by mouth.   Yes [provider]  omeprazole (PRILOSEC) 20 MG capsule Take 20 mg by mouth daily.   Yes [provider]  pravastatin (PRAVACHOL) 40 MG tablet Take 40 mg by mouth daily.   Yes [provider]    Allergies as of 03/29/2022   (No Known Allergies)    Family History  Problem Relation Age of Onset   Heart disease Father    Cancer Mother        brain tumor   Cancer Sister 93       ovarian    Social History   Socioeconomic History   Marital status: Widowed    Spouse name: Not on file   Number of children: Not on file   Years of education: Not on file   Highest education level: Not on file  Occupational History   Not on file  Tobacco Use   Smoking status: Former    Types: Cigarettes   Smokeless tobacco: Never   Tobacco comments:    "Smoked for about 1 year a long time ago"  Vaping Use   Vaping Use: Never used  Substance and Sexual Activity   Alcohol use: No     Alcohol/week: 0.0 standard drinks of alcohol   Drug use: No   Sexual activity: Not on file  Other Topics Concern   Not on file  Social History Narrative   Not on file   Social Determinants of Health   Financial Resource Strain: Not on file  Food Insecurity: Not on file  Transportation Needs: Not on file  Physical Activity: Not on file  Stress: Not on file  Social Connections: Not on file  Intimate Partner Violence: Not on file    Review of Systems: See HPI, otherwise negative ROS  Physical Exam: BP (!) 149/86   Pulse (!) 118   Temp (!) 97.3 F (36.3 C) (Temporal)   Resp 18   Ht 5' (1.524 m)   Wt 59.5 kg   SpO2 99%   BMI 25.62 kg/m  General:   Alert, cooperative in NAD Head:  Normocephalic and atraumatic. Respiratory:  Normal work of breathing. Cardiovascular:  RRR  Impression/Plan: Zoe Bush is here for cataract surgery.  Risks, benefits, limitations, and alternatives regarding cataract surgery have been reviewed with the patient.  Questions have been answered.  All parties agreeable.   Galen Manila, MD  04/17/2022, 10:27 AM

## 2022-04-17 NOTE — Anesthesia Postprocedure Evaluation (Signed)
Anesthesia Post Note  Patient: Zoe Bush  Procedure(s) Performed: CATARACT EXTRACTION PHACO AND INTRAOCULAR LENS PLACEMENT (IOC) RIGHT VISION BLUE (Right: Eye) ANTERIOR VITRECTOMY (Right: Eye)  Patient location during evaluation: PACU Anesthesia Type: MAC Level of consciousness: awake and alert Pain management: pain level controlled Vital Signs Assessment: post-procedure vital signs reviewed and stable Respiratory status: spontaneous breathing, nonlabored ventilation, respiratory function stable and patient connected to nasal cannula oxygen Cardiovascular status: stable and blood pressure returned to baseline Postop Assessment: no apparent nausea or vomiting Anesthetic complications: no   No notable events documented.   Last Vitals:  Vitals:   04/17/22 0953  BP: (!) 149/86  Pulse: (!) 118  Resp: 18  Temp: (!) 36.3 C  SpO2: 99%    Last Pain:  Vitals:   04/17/22 0953  TempSrc: Temporal  PainSc: 0-No pain                 Jadia Capers Lorenza Chick

## 2022-04-18 ENCOUNTER — Encounter: Payer: Self-pay | Admitting: Ophthalmology

## 2022-05-11 DEATH — deceased
